# Patient Record
Sex: Female | Born: 2005 | Race: Asian | Hispanic: No | Marital: Single | Smoking: Never smoker
Health system: Southern US, Community
[De-identification: ages and names within clinical notes are randomized; demographics above are authoritative.]

## PROBLEM LIST (undated history)

## (undated) DIAGNOSIS — R011 Cardiac murmur, unspecified: Secondary | ICD-10-CM

---
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
## 2023-07-30 ENCOUNTER — Emergency Department (HOSPITAL_BASED_OUTPATIENT_CLINIC_OR_DEPARTMENT_OTHER)
Admission: EM | Admit: 2023-07-30 | Discharge: 2023-07-30 | Disposition: A | Discharge status: Home / Self Care | Payer: BLUE CROSS/BLUE SHIELD | Attending: Emergency Medicine | Admitting: Emergency Medicine

## 2023-07-30 ENCOUNTER — Emergency Department (HOSPITAL_BASED_OUTPATIENT_CLINIC_OR_DEPARTMENT_OTHER): Payer: BLUE CROSS/BLUE SHIELD

## 2023-07-30 ENCOUNTER — Other Ambulatory Visit: Payer: Self-pay

## 2023-07-30 ENCOUNTER — Encounter (HOSPITAL_BASED_OUTPATIENT_CLINIC_OR_DEPARTMENT_OTHER): Payer: Self-pay | Admitting: Emergency Medicine

## 2023-07-30 DIAGNOSIS — S0990XA Unspecified injury of head, initial encounter: Secondary | ICD-10-CM | POA: Insufficient documentation

## 2023-07-30 DIAGNOSIS — Y92410 Unspecified street and highway as the place of occurrence of the external cause: Secondary | ICD-10-CM | POA: Insufficient documentation

## 2023-07-30 DIAGNOSIS — R079 Chest pain, unspecified: Secondary | ICD-10-CM | POA: Insufficient documentation

## 2023-07-30 DIAGNOSIS — S161XXA Strain of muscle, fascia and tendon at neck level, initial encounter: Secondary | ICD-10-CM | POA: Diagnosis not present
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
